# Patient Record
Sex: Male | Born: 1970 | Race: White | Hispanic: No | Marital: Married | Smoking: Former smoker
Health system: Southern US, Community
[De-identification: ages and names within clinical notes are randomized; demographics above are authoritative.]

## PROBLEM LIST (undated history)

## (undated) DIAGNOSIS — E0590 Thyrotoxicosis, unspecified without thyrotoxic crisis or storm: Secondary | ICD-10-CM

## (undated) DIAGNOSIS — J40 Bronchitis, not specified as acute or chronic: Secondary | ICD-10-CM

## (undated) DIAGNOSIS — E079 Disorder of thyroid, unspecified: Secondary | ICD-10-CM

## (undated) DIAGNOSIS — Z8719 Personal history of other diseases of the digestive system: Secondary | ICD-10-CM

## (undated) DIAGNOSIS — H0019 Chalazion unspecified eye, unspecified eyelid: Secondary | ICD-10-CM

## (undated) DIAGNOSIS — R0602 Shortness of breath: Secondary | ICD-10-CM

## (undated) DIAGNOSIS — K219 Gastro-esophageal reflux disease without esophagitis: Secondary | ICD-10-CM

## (undated) DIAGNOSIS — E039 Hypothyroidism, unspecified: Secondary | ICD-10-CM

## (undated) DIAGNOSIS — E7800 Pure hypercholesterolemia, unspecified: Secondary | ICD-10-CM

## (undated) HISTORY — PX: CHALAZION EXCISION: SHX213

---

## 1995-01-10 DIAGNOSIS — E0590 Thyrotoxicosis, unspecified without thyrotoxic crisis or storm: Secondary | ICD-10-CM

## 1995-01-10 DIAGNOSIS — E039 Hypothyroidism, unspecified: Secondary | ICD-10-CM

## 1995-01-10 DIAGNOSIS — K219 Gastro-esophageal reflux disease without esophagitis: Secondary | ICD-10-CM

## 1995-01-10 DIAGNOSIS — Z8719 Personal history of other diseases of the digestive system: Secondary | ICD-10-CM

## 1995-01-10 HISTORY — DX: Gastro-esophageal reflux disease without esophagitis: K21.9

## 1995-01-10 HISTORY — DX: Personal history of other diseases of the digestive system: Z87.19

## 1995-01-10 HISTORY — DX: Thyrotoxicosis, unspecified without thyrotoxic crisis or storm: E05.90

## 1995-01-10 HISTORY — DX: Hypothyroidism, unspecified: E03.9

## 2012-12-23 ENCOUNTER — Telehealth: Payer: Self-pay | Admitting: *Deleted

## 2012-12-27 ENCOUNTER — Ambulatory Visit: Payer: Self-pay | Admitting: Cardiovascular Disease

## 2012-12-30 ENCOUNTER — Encounter (HOSPITAL_COMMUNITY): Payer: Self-pay | Admitting: Emergency Medicine

## 2012-12-30 ENCOUNTER — Observation Stay (HOSPITAL_COMMUNITY)
Admission: EM | Admit: 2012-12-30 | Discharge: 2012-12-31 | Disposition: A | Discharge status: Home / Self Care | Payer: BC Managed Care – PPO | Attending: Cardiovascular Disease | Admitting: Cardiovascular Disease

## 2012-12-30 ENCOUNTER — Emergency Department (HOSPITAL_COMMUNITY): Payer: BC Managed Care – PPO

## 2012-12-30 DIAGNOSIS — J40 Bronchitis, not specified as acute or chronic: Secondary | ICD-10-CM | POA: Diagnosis present

## 2012-12-30 DIAGNOSIS — H0019 Chalazion unspecified eye, unspecified eyelid: Secondary | ICD-10-CM | POA: Insufficient documentation

## 2012-12-30 DIAGNOSIS — R0789 Other chest pain: Principal | ICD-10-CM

## 2012-12-30 DIAGNOSIS — R059 Cough, unspecified: Secondary | ICD-10-CM | POA: Insufficient documentation

## 2012-12-30 DIAGNOSIS — Z87891 Personal history of nicotine dependence: Secondary | ICD-10-CM | POA: Insufficient documentation

## 2012-12-30 DIAGNOSIS — E785 Hyperlipidemia, unspecified: Secondary | ICD-10-CM

## 2012-12-30 DIAGNOSIS — R072 Precordial pain: Secondary | ICD-10-CM

## 2012-12-30 DIAGNOSIS — R05 Cough: Secondary | ICD-10-CM | POA: Insufficient documentation

## 2012-12-30 DIAGNOSIS — K219 Gastro-esophageal reflux disease without esophagitis: Secondary | ICD-10-CM | POA: Insufficient documentation

## 2012-12-30 DIAGNOSIS — R079 Chest pain, unspecified: Secondary | ICD-10-CM

## 2012-12-30 DIAGNOSIS — E7800 Pure hypercholesterolemia, unspecified: Secondary | ICD-10-CM | POA: Insufficient documentation

## 2012-12-30 DIAGNOSIS — E039 Hypothyroidism, unspecified: Secondary | ICD-10-CM | POA: Insufficient documentation

## 2012-12-30 DIAGNOSIS — R0602 Shortness of breath: Secondary | ICD-10-CM | POA: Insufficient documentation

## 2012-12-30 DIAGNOSIS — Z8709 Personal history of other diseases of the respiratory system: Secondary | ICD-10-CM | POA: Insufficient documentation

## 2012-12-30 HISTORY — DX: Bronchitis, not specified as acute or chronic: J40

## 2012-12-30 HISTORY — DX: Shortness of breath: R06.02

## 2012-12-30 HISTORY — DX: Thyrotoxicosis, unspecified without thyrotoxic crisis or storm: E05.90

## 2012-12-30 HISTORY — DX: Gastro-esophageal reflux disease without esophagitis: K21.9

## 2012-12-30 HISTORY — DX: Pure hypercholesterolemia, unspecified: E78.00

## 2012-12-30 HISTORY — DX: Disorder of thyroid, unspecified: E07.9

## 2012-12-30 HISTORY — DX: Hypothyroidism, unspecified: E03.9

## 2012-12-30 HISTORY — DX: Personal history of other diseases of the digestive system: Z87.19

## 2012-12-30 HISTORY — DX: Chalazion unspecified eye, unspecified eyelid: H00.19

## 2012-12-30 LAB — D-DIMER, QUANTITATIVE: D-Dimer, Quant: <0.27 ug/mL-FEU (ref 0.00–0.48)

## 2012-12-30 LAB — TROPONIN I
Troponin I: <0.3 ng/mL (ref <0.30)
Troponin I: <0.3 ng/mL (ref <0.30)
Troponin I: <0.3 ng/mL (ref <0.30)

## 2012-12-30 LAB — BASIC METABOLIC PANEL
BUN: 10 mg/dL (ref 6–23)
CO2: 25 mEq/L (ref 19–32)
Calcium: 8.6 mg/dL (ref 8.4–10.5)
Creatinine, Ser: 0.86 mg/dL (ref 0.50–1.35)
GFR calc non Af Amer: >90 mL/min (ref >90)
Glucose, Bld: 99 mg/dL (ref 70–99)

## 2012-12-30 LAB — CBC
HCT: 40.3 % (ref 39.0–52.0)
Hemoglobin: 14.2 g/dL (ref 13.0–17.0)
MCH: 31.3 pg (ref 26.0–34.0)
MCHC: 35.2 g/dL (ref 30.0–36.0)
RBC: 4.54 MIL/uL (ref 4.22–5.81)

## 2012-12-30 LAB — TSH: TSH: 2.107 u[IU]/mL (ref 0.350–4.500)

## 2012-12-30 MED ORDER — LEVOTHYROXINE SODIUM 150 MCG PO TABS
150.0000 ug | ORAL_TABLET | Freq: Every day | ORAL | Status: DC
  Administered 2012-12-31: 150 ug via ORAL
  Filled 2012-12-30 (×2): qty 1

## 2012-12-30 MED ORDER — PANTOPRAZOLE SODIUM 40 MG PO TBEC
40.0000 mg | DELAYED_RELEASE_TABLET | Freq: Every day | ORAL | Status: DC
  Administered 2012-12-30 – 2012-12-31 (×2): 40 mg via ORAL
  Filled 2012-12-30 (×2): qty 1

## 2012-12-30 MED ORDER — ACETAMINOPHEN 325 MG PO TABS
650.0000 mg | ORAL_TABLET | ORAL | Status: DC | PRN
  Administered 2012-12-30 – 2012-12-31 (×4): 650 mg via ORAL
  Filled 2012-12-30 (×4): qty 2

## 2012-12-30 MED ORDER — ZOLPIDEM TARTRATE 5 MG PO TABS: 5.0000 mg | ORAL_TABLET | Freq: Every evening | ORAL | Status: DC | PRN

## 2012-12-30 MED ORDER — ASPIRIN 81 MG PO CHEW
324.0000 mg | CHEWABLE_TABLET | Freq: Once | ORAL | Status: AC
  Administered 2012-12-30: 162 mg via ORAL
  Filled 2012-12-30: qty 4

## 2012-12-30 MED ORDER — NITROGLYCERIN 0.4 MG SL SUBL
0.4000 mg | SUBLINGUAL_TABLET | SUBLINGUAL | Status: DC | PRN
  Filled 2012-12-30: qty 25

## 2012-12-30 MED ORDER — ALPRAZOLAM 0.25 MG PO TABS
0.2500 mg | ORAL_TABLET | Freq: Three times a day (TID) | ORAL | Status: DC | PRN
  Filled 2012-12-30: qty 1

## 2012-12-30 NOTE — H&P (Signed)
Patient ID: Steve Coleman MRN: [id number], DOB/AGE: [birth year]   Admit date: 12/30/2012   Primary Physician: No primary provider on file. Primary Cardiologist: Dr Allyson Sabal (new)  HPI: 42 y/o male with no prior cardiac issues except for tachycardia when he had hyperthyroidism > 10 yrs ago. He has noticed intermittent SSCP-"pressure" off and on since November. The first time he had it he was awakened from sleep at 3 am. He says it lasts one and a half to two hours. He denies any associated diaphoresis or nausea. He denies any radiation to his arms or jaw. He has a vague sense of SOB with this. He went to the ER in [facility] about a week ago for this and was told everything looked OK. He had an appointment with Dr Tresa Endo this past Friday but there was some confusion about his contact information and the appointment ended up getting rescheduled for Jan 13th.       He came to the ER today because he has been having this pain since 10pm last night. He says it's a 1-2/10. He is quit anxious about what this could be. He was told at [facility] that his EKG showed his "heart may be enlarged".    Problem List: Past Medical History  Diagnosis Date  . Hypercholesteremia   . Thyroid disease     History reviewed. No pertinent past surgical history.   Allergies:  Allergies  Allergen Reactions  . Statins     Causes chest pressure  . Epinephrine Palpitations    Heart racing       Home Medications Current Facility-Administered Medications  Medication Dose Route Frequency Provider Last Rate Last Dose  . nitroGLYCERIN (NITROSTAT) SL tablet 0.4 mg  0.4 mg Sublingual Q5 min PRN Audree Camel, MD       Current Outpatient Prescriptions  Medication Sig Dispense Refill  . levothyroxine (SYNTHROID, LEVOTHROID) 150 MCG tablet Take 150 mcg by mouth daily before breakfast.      . Multiple Vitamins-Minerals (CENTRUM SILVER ULTRA MENS PO) Take 1 tablet by mouth every morning.      . fenofibrate  micronized (LOFIBRA) 200 MG capsule Take 200 mg by mouth daily before breakfast.         FMHx: negative for CAD, DM   History   Social History  . Marital Status: Married    Spouse Name: N/A    Number of Children: N/A  . Years of Education: N/A   Occupational History  . Not on file.   Social History Main Topics  . Smoking status: Never Smoker   . Smokeless tobacco: Not on file  . Alcohol Use: No  . Drug Use: No  . Sexual Activity: Not on file   Other Topics Concern  . Not on file   Social History Narrative  . No narrative on file   He works at an [employer].   Review of Systems: General: negative for chills, fever, night sweats or weight changes.  Cardiovascular:negative for orthopnea, palpitations, paroxysmal nocturnal dyspnea or shortness of breath Dermatological: negative for rash Respiratory: negative for cough or wheezing Urologic: negative for hematuria Abdominal: negative for nausea, vomiting, diarrhea, bright red blood per rectum, melena, or hematemesis Neurologic: negative for visual changes, syncope, or dizziness All other systems reviewed and are otherwise negative except as noted above.  Physical Exam: Blood pressure 133/89, pulse 77, temperature 98.6 F (37 C), temperature source Oral, resp. rate 16, height 6' (1.829 m), weight 222 lb (100.699 kg), SpO2  99.00%.  General appearance: alert, cooperative and no distress Neck: no carotid bruit and no JVD Lungs: clear to auscultation bilaterally Heart: regular rate and rhythm, S1, S2 normal, no murmur, click, rub or gallop Abdomen: soft, non-tender; bowel sounds normal; no masses,  no organomegaly Extremities: extremities normal, atraumatic, no cyanosis or edema Pulses: 2+ and symmetric Skin: Skin color, texture, turgor normal. No rashes or lesions Neurologic: Grossly normal   Labs:   Results for orders placed during the hospital encounter of 12/30/12 (from the past 24 hour(s))  CBC     Status:  Abnormal   Collection Time    12/30/12  7:51 AM      Result Value Range   WBC 6.4  4.0 - 10.5 K/uL   RBC 4.54  4.22 - 5.81 MIL/uL   Hemoglobin 14.2  13.0 - 17.0 g/dL   HCT 16.1  09.6 - 04.5 %   MCV 88.8  78.0 - 100.0 fL   MCH 31.3  26.0 - 34.0 pg   MCHC 35.2  30.0 - 36.0 g/dL   RDW 40.9  81.1 - 91.4 %   Platelets 143 (*) 150 - 400 K/uL  BASIC METABOLIC PANEL     Status: None   Collection Time    12/30/12  7:51 AM      Result Value Range   Sodium 138  135 - 145 mEq/L   Potassium 3.9  3.5 - 5.1 mEq/L   Chloride 104  96 - 112 mEq/L   CO2 25  19 - 32 mEq/L   Glucose, Bld 99  70 - 99 mg/dL   BUN 10  6 - 23 mg/dL   Creatinine, Ser 7.82  0.50 - 1.35 mg/dL   Calcium 8.6  8.4 - 95.6 mg/dL   GFR calc non Af Amer >90  >90 mL/min   GFR calc Af Amer >90  >90 mL/min  POCT I-STAT TROPONIN I     Status: None   Collection Time    12/30/12  8:03 AM      Result Value Range   Troponin i, poc 0.00  0.00 - 0.08 ng/mL   Comment 3              Radiology/Studies: Dg Chest 2 View  12/30/2012   CLINICAL DATA:  Chest pain  EXAM: CHEST  2 VIEW  COMPARISON:  None.  FINDINGS: Lungs are clear. Heart size and pulmonary vascularity are normal. No adenopathy. No pneumothorax. No bone lesions.  IMPRESSION: No abnormality noted.   Electronically Signed   By: Bretta Bang M.D.   On: 12/30/2012 07:40    EKG:NSR without acute changes  ASSESSMENT AND PLAN:  Active Problems:   Chest tightness or pressure   Hypothyroid   Dyslipidemia   PLAN: Admit for obs, r/o MI. Check echo, schedule exercise Myoview for am.  Add d dimer and TSH to labs.    Deland Pretty, PA-C 12/30/2012, 9:57 AM  Agree with note written by Corine Shelter [facility]  Patient with cardiac risk factors notable for hyperlipidemia and to tolerate statin drugs. He's had 5-6 episodes of worrisome chest pain several which awakened him from sleep. He currently has mild low-grade chest pressure. The exam is benign, labs are normal an EKG  shows no acute changes. Plans Will be to heparinize him, put him in stepped-down and arranged to undergo Myoview stress testing tomorrow. If the stress test is normal he'll need a GI evaluation, otherwise he'll need cardiac catheterization.  Runell Gess  12/30/2012 2:29 PM

## 2012-12-30 NOTE — ED Notes (Signed)
Pt c/o mid sternal chest pressure intermittently x weeks; pt sts started last night; pt seen at [facility] for same with negative cardiac workup; pt sts feels like lump in throat and pressure to take deep breath

## 2012-12-30 NOTE — ED Provider Notes (Signed)
CSN: [id number]     Arrival date & time 12/30/12  4098 History   First MD Initiated Contact with Patient 12/30/12 0735     Chief Complaint  Patient presents with  . Chest Pain   (Consider location/radiation/quality/duration/timing/severity/associated sxs/prior Treatment) HPI Comments: 42 year old male presents with intermittent chest pain/pressure and dyspnea for the past 6-7 weeks. He states he gets it about once or twice a week her periods most recent time occurred about 10 hours ago and has been constant. He states feels like a 1/10 pressure with some trouble catching his breath. He does not seem to think it gets worse with deep breathing. Symptoms really hasn't a lump in his throat. There is no exertional symptoms, no fever, nausea, vomiting, or diaphoresis. Patient no abdominal symptoms. 10 days ago he went to [facility] ED because he had a prolonged episode similar to this and told he was not having a heart attack. That time they arrange for him to followup with Dr. Allyson Sabal, of [facility] heart and vascular. His appointment was 3 days ago when he arrived was a mixup in his address and they apparently had canceled his appointment. His next one is not for another 3 weeks. Due to this he presented to the ED because he wants to know what is going on. States he's had a dry cough but denies hemoptysis, denies recent travel or surgery, denies leg swelling. No history of blood clots per his knowledge.   Past Medical History  Diagnosis Date  . Hypercholesteremia   . Thyroid disease    History reviewed. No pertinent past surgical history. History reviewed. No pertinent family history. History  Substance Use Topics  . Smoking status: Never Smoker   . Smokeless tobacco: Not on file  . Alcohol Use: No    Review of Systems  Constitutional: Negative for fever, chills and diaphoresis.  Respiratory: Positive for cough and shortness of breath. Negative for wheezing.   Cardiovascular: Positive for chest  pain. Negative for leg swelling.  Gastrointestinal: Negative for nausea, vomiting and abdominal pain.  All other systems reviewed and are negative.    Allergies  Review of patient's allergies indicates no known allergies.  Home Medications  No current outpatient prescriptions on file. BP 121/76  Pulse 77  Temp(Src) 98.6 F (37 C) (Oral)  Resp 14  Ht 6' (1.829 m)  Wt 222 lb (100.699 kg)  BMI 30.10 kg/m2  SpO2 96% Physical Exam  Nursing note and vitals reviewed. Constitutional: He is oriented to person, place, and time. He appears well-developed and well-nourished. No distress.  HENT:  Head: Normocephalic and atraumatic.  Right Ear: External ear normal.  Left Ear: External ear normal.  Nose: Nose normal.  Eyes: Right eye exhibits no discharge. Left eye exhibits no discharge.  Neck: Neck supple.  Cardiovascular: Normal rate, regular rhythm, normal heart sounds and intact distal pulses.   Pulmonary/Chest: Effort normal and breath sounds normal. He has no wheezes. He exhibits no tenderness.  Abdominal: Soft. He exhibits no distension. There is no tenderness.  Musculoskeletal: He exhibits no edema.  Neurological: He is alert and oriented to person, place, and time.  Skin: Skin is warm and dry.    ED Course  Procedures (including critical care time) Labs Review Labs Reviewed  CBC - Abnormal; Notable for the following:    Platelets 143 (*)    All other components within normal limits  BASIC METABOLIC PANEL  D-DIMER, QUANTITATIVE  TSH  TROPONIN I  TROPONIN I  TROPONIN I  POCT I-STAT TROPONIN I   Imaging Review Dg Chest 2 View  12/30/2012   CLINICAL DATA:  Chest pain  EXAM: CHEST  2 VIEW  COMPARISON:  None.  FINDINGS: Lungs are clear. Heart size and pulmonary vascularity are normal. No adenopathy. No pneumothorax. No bone lesions.  IMPRESSION: No abnormality noted.   Electronically Signed   By: Bretta Bang M.D.   On: 12/30/2012 07:40    EKG Interpretation     Date/Time:  Monday December 30 2012 07:14:35 EST Ventricular Rate:  81 PR Interval:  140 QRS Duration: 86 QT Interval:  378 QTC Calculation: 439 R Axis:   91 Text Interpretation:  Normal sinus rhythm Rightward axis Borderline ECG no acute ischemia No old tracing to compare Confirmed by Caleen Taaffe  MD, Jannie Doyle ([id number]) on 12/30/2012 7:23:19 AM            MDM   1. Chest tightness or pressure    EKG and troponin benign. Patient is PERC negative and low risk, thus I feel further testing isn't warranted at this time. D/w Dr. Allyson Sabal, he will admit to obs for ACS r/o.     Audree Camel, MD 12/30/12 1022

## 2012-12-30 NOTE — ED Notes (Signed)
Pt currently refusing Nitro. EDP notified.

## 2012-12-30 NOTE — Progress Notes (Signed)
  Echocardiogram 2D Echocardiogram has been performed.  Cathie Beams 12/30/2012, 11:50 AM

## 2012-12-31 ENCOUNTER — Observation Stay (HOSPITAL_COMMUNITY): Payer: BC Managed Care – PPO

## 2012-12-31 ENCOUNTER — Encounter (HOSPITAL_COMMUNITY): Payer: Self-pay | Admitting: Cardiology

## 2012-12-31 ENCOUNTER — Encounter (HOSPITAL_COMMUNITY): Payer: BC Managed Care – PPO

## 2012-12-31 DIAGNOSIS — J40 Bronchitis, not specified as acute or chronic: Secondary | ICD-10-CM

## 2012-12-31 HISTORY — DX: Bronchitis, not specified as acute or chronic: J40

## 2012-12-31 LAB — INFLUENZA PANEL BY PCR (TYPE A & B)
H1N1 flu by pcr: NOT DETECTED
Influenza A By PCR: NEGATIVE
Influenza B By PCR: NEGATIVE

## 2012-12-31 MED ORDER — HYDROCOD POLST-CHLORPHEN POLST 10-8 MG/5ML PO LQCR: 5.0000 mL | Freq: Two times a day (BID) | ORAL | Status: DC | PRN

## 2012-12-31 MED ORDER — TECHNETIUM TC 99M SESTAMIBI GENERIC - CARDIOLITE
30.0000 | Freq: Once | INTRAVENOUS | Status: AC | PRN
  Administered 2012-12-31: 30 via INTRAVENOUS

## 2012-12-31 MED ORDER — GUAIFENESIN ER 600 MG PO TB12: 1200.0000 mg | ORAL_TABLET | Freq: Two times a day (BID) | ORAL | Status: AC

## 2012-12-31 MED ORDER — ACETAMINOPHEN 325 MG PO TABS: 650.0000 mg | ORAL_TABLET | ORAL | Status: DC | PRN

## 2012-12-31 MED ORDER — PANTOPRAZOLE SODIUM 40 MG PO TBEC: 40.0000 mg | DELAYED_RELEASE_TABLET | Freq: Every day | ORAL | Status: DC

## 2012-12-31 MED ORDER — LEVOFLOXACIN 500 MG PO TABS: 500.0000 mg | ORAL_TABLET | Freq: Every day | ORAL | Status: DC

## 2012-12-31 MED ORDER — TECHNETIUM TC 99M SESTAMIBI GENERIC - CARDIOLITE
10.0000 | Freq: Once | INTRAVENOUS | Status: AC | PRN
  Administered 2012-12-31: 10 via INTRAVENOUS

## 2012-12-31 MED ORDER — GUAIFENESIN ER 600 MG PO TB12
1200.0000 mg | ORAL_TABLET | Freq: Two times a day (BID) | ORAL | Status: DC
  Administered 2012-12-31: 1200 mg via ORAL
  Filled 2012-12-31 (×3): qty 2

## 2012-12-31 MED ORDER — LEVOFLOXACIN 500 MG PO TABS
500.0000 mg | ORAL_TABLET | Freq: Every day | ORAL | Status: DC
  Administered 2012-12-31: 500 mg via ORAL
  Filled 2012-12-31 (×2): qty 1

## 2012-12-31 NOTE — Discharge Summary (Signed)
Physician Discharge Summary       Patient ID: Steve Coleman MRN: [id number] DOB/AGE: 1970-04-01 42 y.o.  Admit date: 12/30/2012 Discharge date: 12/31/2012  Discharge Diagnoses:  Principal Problem:   Chest tightness or pressure, negative stress nuculear study, normal echo, possible GI Active Problems:   Hypothyroid   Dyslipidemia   Bronchitis   Discharged Condition: good  Procedures: none   Hospital Course: 42 y/o male with no prior cardiac issues except for tachycardia when he had hyperthyroidism > 10 yrs ago. He has noticed intermittent SSCP-"pressure" off and on since November. The first time he had it he was awakened from sleep at 3 am. He says it lasts one and a half to two hours. He denies any associated diaphoresis or nausea. He denies any radiation to his arms or jaw. He has a vague sense of SOB with this. He went to the ER in [facility] about a week ago for this and was told everything looked OK. He had an appointment with Dr Tresa Endo this past Friday but there was some confusion about his contact information and the appointment ended up getting rescheduled for Jan 13th. He came to the ER 12/30/12 because he has been having this pain since 10pm last night. He says it's a 1-2/10. He is quit anxious about what this could be. He was told at [facility] that his EKG showed his "heart may be enlarged".   CXR here without cardiomegaly.  Pt placed in OBS and troponins returned negative.  Echo was normal and exercise stress test was without ischemia.   Dr. Allyson Sabal saw and felt this may be GI and we will arrange GI consult.  Also he spiked a fever here and flu eval was negative.  He did receive flu vaccine in Nov.  I added ABX and musinex.  Pt was found stable for discharge home and will follow up as an outpt.     Consults: None  Significant Diagnostic Studies:  BMET    Component Value Date/Time   NA 138 12/30/2012 0751   K 3.9 12/30/2012 0751   CL 104 12/30/2012 0751   CO2  25 12/30/2012 0751   GLUCOSE 99 12/30/2012 0751   BUN 10 12/30/2012 0751   CREATININE 0.86 12/30/2012 0751   CALCIUM 8.6 12/30/2012 0751   GFRNONAA >90 12/30/2012 0751   GFRAA >90 12/30/2012 0751    CBC    Component Value Date/Time   WBC 6.4 12/30/2012 0751   RBC 4.54 12/30/2012 0751   HGB 14.2 12/30/2012 0751   HCT 40.3 12/30/2012 0751   PLT 143* 12/30/2012 0751   MCV 88.8 12/30/2012 0751   MCH 31.3 12/30/2012 0751   MCHC 35.2 12/30/2012 0751   RDW 12.5 12/30/2012 0751   ddimer <0.27, TSH 2.107,   Troponin I neg X 3.   CXR:   FINDINGS: Lungs are clear. Heart size and pulmonary vascularity are normal. No adenopathy. No pneumothorax. No bone lesions  EXERCISE MYOVIEW: FINDINGS: SPECT: No definite inducible or reversible ischemia with exercise stress. No fixed defects.  Wall motion: Grossly normal wall motion and thickening  Ejection fraction: Calculated ejection fraction is 63%.  IMPRESSION: Negative for inducible ischemia with exercise stress.  2D Echo: Left ventricle: The cavity size was normal. Systolic function was normal. The estimated ejection fraction was in the range of 55% to 60%. Wall motion was normal; there were no regional wall motion abnormalities. Left ventricular diastolic function parameters were normal  Discharge Exam: Blood pressure 133/73, pulse  108, temperature 99.9 F (37.7 C), temperature source Oral, resp. rate 18, height 6' (1.829 m), weight 222 lb (100.699 kg), SpO2 95.00%.   AM exam:PE: General:Pleasant affect, NAD  Skin:Warm and dry, brisk capillary refill  HEENT:normocephalic, sclera clear, mucus membranes moist, voice raspy  Heart:S1S2 RRR without murmur, gallup, rub or click  Lungs:clear without rales, rhonchi, or wheezes  ZOX:WRUE, non tender, + BS, do not palpate liver spleen or masses  Ext:no lower ext edema, 2+ pedal pulses, 2+ radial pulses  Neuro:alert and oriented, MAE, follows commands, + facial symmetry   Disposition:  01-Home or Self Care       Future Appointments Provider Department Dept Phone   01/21/2013 8:45 AM Runell Gess, MD [facility] [phone]       Medication List         acetaminophen 325 MG tablet  Commonly known as:  TYLENOL  Take 2 tablets (650 mg total) by mouth every 4 (four) hours as needed for fever, headache or mild pain.     CENTRUM SILVER ULTRA MENS PO  Take 1 tablet by mouth every morning.     chlorpheniramine-HYDROcodone 10-8 MG/5ML Lqcr  Commonly known as:  TUSSIONEX  Take 5 mLs by mouth every 12 (twelve) hours as needed for cough.     fenofibrate micronized 200 MG capsule  Commonly known as:  LOFIBRA  Take 200 mg by mouth daily before breakfast.     guaiFENesin 600 MG 12 hr tablet  Commonly known as:  MUCINEX  Take 2 tablets (1,200 mg total) by mouth 2 (two) times daily.     levofloxacin 500 MG tablet  Commonly known as:  LEVAQUIN  Take 1 tablet (500 mg total) by mouth daily.     levothyroxine 150 MCG tablet  Commonly known as:  SYNTHROID, LEVOTHROID  Take 150 mcg by mouth daily before breakfast.     pantoprazole 40 MG tablet  Commonly known as:  PROTONIX  Take 1 tablet (40 mg total) by mouth daily at 6 (six) AM.       Follow-up Information   Follow up with Runell Gess, MD. (our office will call with date and time)    Specialty:  Cardiology   Contact information:   [address] [place] [address] [phone]        Discharge Instructions:Take protonix daily  Added Musinex over the counter  I gave a cough syrup and an antibiotic.  Flu screen was negative.  We will schedule you to see Dr. Elnoria Howard with GI for eval.  Signed: Leone Brand Nurse Practitioner-Certified [facility]: [facility] 12/31/2012, 6:19 PM  Time spent on discharge : 40 minutes.

## 2012-12-31 NOTE — Progress Notes (Signed)
Discharge instructions and prescriptions given.  No questions asked.  Patient left via walking to family member waiting to take him home.    Rubye Oaks

## 2012-12-31 NOTE — Progress Notes (Signed)
UR completed 

## 2012-12-31 NOTE — Progress Notes (Signed)
         Subjective: Ready for nuc study  Objective: Vital signs in last 24 hours: Temp:  [98.2 F (36.8 C)-101.2 F (38.4 C)] 99.9 F (37.7 C) (12/23 0609) Pulse Rate:  [87-108] 108 (12/23 0609) Resp:  [15-18] 18 (12/23 0609) BP: (101-128)/(63-79) 101/63 mmHg (12/23 0609) SpO2:  [95 %-98 %] 95 % (12/23 0609) Weight change:    Intake/Output from previous day:  +240 12/22 0701 - 12/23 0700 In: 240 [P.O.:240] Out: -  Intake/Output this shift:    PE: General:Pleasant affect, NAD Skin:Warm and dry, brisk capillary refill HEENT:normocephalic, sclera clear, mucus membranes moist, voice raspy Heart:S1S2 RRR without murmur, gallup, rub or click Lungs:clear without rales, rhonchi, or wheezes ZOX:WRUE, non tender, + BS, do not palpate liver spleen or masses Ext:no lower ext edema, 2+ pedal pulses, 2+ radial pulses Neuro:alert and oriented, MAE, follows commands, + facial symmetry   Lab Results:  Recent Labs  12/30/12 0751  WBC 6.4  HGB 14.2  HCT 40.3  PLT 143*   BMET  Recent Labs  12/30/12 0751  NA 138  K 3.9  CL 104  CO2 25  GLUCOSE 99  BUN 10  CREATININE 0.86  CALCIUM 8.6    Recent Labs  12/30/12 1535 12/30/12 2217  TROPONINI <0.30 <0.30    No results found for this basename: CHOL,  HDL,  LDLCALC,  LDLDIRECT,  TRIG,  CHOLHDL   No results found for this basename: HGBA1C     Lab Results  Component Value Date   TSH 2.107 12/30/2012    2 D echo: Left ventricle: The cavity size was normal. Systolic function was normal. The estimated ejection fraction was in the range of 55% to 60%. Wall motion was normal; there were no regional wall motion abnormalities. Left ventricular diastolic function parameters were normal   Studies/Results: Dg Chest 2 View  12/30/2012   CLINICAL DATA:  Chest pain  EXAM: CHEST  2 VIEW  COMPARISON:  None.  FINDINGS: Lungs are clear. Heart size and pulmonary vascularity are normal. No adenopathy. No pneumothorax. No bone  lesions.  IMPRESSION: No abnormality noted.   Electronically Signed   By: Bretta Bang M.D.   On: 12/30/2012 07:40    Medications: I have reviewed the patient's current medications. Scheduled Meds: . levothyroxine  150 mcg Oral QAC breakfast  . pantoprazole  40 mg Oral Q0600   Continuous Infusions:  PRN Meds:.acetaminophen, ALPRAZolam, nitroGLYCERIN, zolpidem  Assessment/Plan: Principal Problem:   Chest tightness or pressure Active Problems:   Hypothyroid   Dyslipidemia  PLAN: negative MI, normal echo.  For nuc study. Fever during the night, pt did receive flu vaccine in November.  Influenza panel has been sent.  Will add musinex and Levaquin with brown productive mucus.  Pt intolerant to PCN and has had problems with erythromycin in the past.  Flu study negative, treating URI. No pna on CXR.   Stress myoview completed without complications.   LOS: 1 day   Time spent with pt. :15 minutes. [facility] R  Nurse Practitioner Certified Pager [phone] or after 5pm and on weekends call [phone] 12/31/2012, 9:56 AM   Agree with note written by Nada Boozer RNP  Myoview neg. On ATBX for ? Bronchitis. OK for D/C home . Needs appt with Dr Elnoria Howard for GI eval then ROV with me 3 months.  Runell Gess 12/31/2012 2:36 PM

## 2013-01-01 ENCOUNTER — Telehealth: Payer: Self-pay | Admitting: Cardiovascular Disease

## 2013-01-01 NOTE — Telephone Encounter (Signed)
Per Nada Boozer, NP----patient needs to see Dr. Jeani Hawking for non cardiac chest pain.  I scheduled appointment for Monday 01/06/13 @ 2:30 pm.  I spoke with Mr. Knerr @ 8:42 am and he is aware of the appontment--I also gave him Dr. Haywood Pao address and phone number.

## 2013-01-01 NOTE — Progress Notes (Signed)
01/01/2013 1210 No NCM needs identified. Isidoro Donning RN CCM Case Mgmt phone [phone]

## 2013-01-21 ENCOUNTER — Ambulatory Visit: Payer: Self-pay | Admitting: Cardiovascular Disease

## 2013-05-01 NOTE — Telephone Encounter (Signed)
Encounter Closed on 05/01/13 by TP 

## 2013-10-24 ENCOUNTER — Other Ambulatory Visit: Payer: Self-pay

## 2017-02-13 ENCOUNTER — Ambulatory Visit
Admission: EM | Admit: 2017-02-13 | Discharge: 2017-02-13 | Disposition: A | Discharge status: Home / Self Care | Payer: BLUE CROSS/BLUE SHIELD | Attending: Family Medicine | Admitting: Family Medicine

## 2017-02-13 ENCOUNTER — Encounter: Payer: Self-pay | Admitting: *Deleted

## 2017-02-13 ENCOUNTER — Other Ambulatory Visit: Payer: Self-pay

## 2017-02-13 DIAGNOSIS — J069 Acute upper respiratory infection, unspecified: Secondary | ICD-10-CM | POA: Diagnosis not present

## 2017-02-13 MED ORDER — FLUTICASONE PROPIONATE 50 MCG/ACT NA SUSP: 2.0000 | Freq: Every day | NASAL | 0 refills | Status: AC

## 2017-02-13 MED ORDER — BENZONATATE 200 MG PO CAPS: ORAL_CAPSULE | ORAL | 0 refills | Status: AC

## 2017-02-13 NOTE — ED Provider Notes (Signed)
[facility]    CSN: [id number] Arrival date & time: 02/13/17  1250     History   Chief Complaint Chief Complaint  Patient presents with  . Cough  . Headache    HPI Steve Coleman is a 47 y.o. male.   HPI  47 year old male who started having symptoms of nasal Desgen cough and headache along with fever 3 days ago.  Seem to worsen.  Had some chills fever today of 99.4.  O2 sats are 97% on room air.       Past Medical History:  Diagnosis Date  . Bronchitis 12/31/2012  . Chalazion   . Exertional shortness of breath    "walking up flights of stairs; last 6=-8 weeks' (12/30/2012)  . GERD (gastroesophageal reflux disease) 1997   hx  . H/O hiatal hernia 1997   hx  . High cholesterol   . Hypercholesteremia   . Hyperthyroidism 1997   hx  . Hypothyroidism 1997   "had thyroid radiated" (12/30/2012)  . Thyroid disease     Patient Active Problem List   Diagnosis Date Noted  . Bronchitis 12/31/2012  . Chest tightness or pressure, negative stress nuculear study, normal echo, possible GI 12/30/2012  . Hypothyroid 12/30/2012  . Dyslipidemia 12/30/2012    Past Surgical History:  Procedure Laterality Date  . CHALAZION EXCISION Bilateral [id number]; 2010-2013   "involving all 4 eyelids; I've had 8-12; last one was < 1 yr ago" (12/30/2012)       Home Medications    Prior to Admission medications   Medication Sig Start Date End Date Taking? Authorizing Provider  fenofibrate micronized (LOFIBRA) 200 MG capsule Take 200 mg by mouth daily before breakfast.   Yes [provider]  guaiFENesin (MUCINEX) 600 MG 12 hr tablet Take 2 tablets (1,200 mg total) by mouth 2 (two) times daily. 12/31/12  Yes Leone BrandIngold, Laura R, NP  levothyroxine (SYNTHROID, LEVOTHROID) 150 MCG tablet Take 150 mcg by mouth daily before breakfast.   Yes [provider]  benzonatate (TESSALON) 200 MG capsule Take one cap TID PRN cough 02/13/17   Lutricia Feiloemer, Kaili Castille P, PA-C    fluticasone (FLONASE) 50 MCG/ACT nasal spray Place 2 sprays into both nostrils daily. 02/13/17   Lutricia Feiloemer, Kalifa Cadden P, PA-C    Family History Family History  Problem Relation Age of Onset  . Hyperlipidemia Mother   . Heart failure Father   . Hyperlipidemia Father   . Cancer Father     Social History Social History   Tobacco Use  . Smoking status: Former Smoker    Packs/day: 0.12    Years: 4.00    Pack years: 0.48    Types: Cigarettes    Last attempt to quit: 06/10/1991    Years since quitting: 25.6  . Smokeless tobacco: Former User    Types: Snuff, Dorna BloomChew    Quit date: 06/10/1986  Substance Use Topics  . Alcohol use: Yes    Alcohol/week: 4.2 oz    Types: 1 Glasses of wine, 4 Cans of beer, 2 Shots of liquor per week  . Drug use: No     Allergies   Augmentin [amoxicillin-pot clavulanate]; Erythromycin; Statins; and Epinephrine   Review of Systems Review of Systems  Constitutional: Positive for activity change, chills, fatigue and fever.  HENT: Positive for congestion, postnasal drip, sinus pressure and sinus pain.   Respiratory: Positive for cough.   All other systems reviewed and are negative.    Physical Exam Triage Vital Signs ED Triage  Vitals  Enc Vitals Group     BP 02/13/17 1331 126/75     Pulse Rate 02/13/17 1331 90     Resp 02/13/17 1331 16     Temp 02/13/17 1331 99.4 F (37.4 C)     Temp Source 02/13/17 1331 Oral     SpO2 02/13/17 1331 97 %     Weight 02/13/17 1333 210 lb (95.3 kg)     Height 02/13/17 1333 6' (1.829 m)     Head Circumference --      Peak Flow --      Pain Score 02/13/17 1333 0     Pain Loc --      Pain Edu? --      Excl. in GC? --    No data found.  Updated Vital Signs BP 126/75 (BP Location: Left Arm)   Pulse 90   Temp 99.4 F (37.4 C) (Oral)   Resp 16   Ht 6' (1.829 m)   Wt 210 lb (95.3 kg)   SpO2 97%   BMI 28.48 kg/m   Visual Acuity Right Eye Distance:   Left Eye Distance:   Bilateral Distance:    Right Eye Near:    Left Eye Near:    Bilateral Near:     Physical Exam  Constitutional: He is oriented to person, place, and time. He appears well-developed and well-nourished.  Non-toxic appearance. He does not appear ill. No distress.  HENT:  Head: Normocephalic.  Eyes: Pupils are equal, round, and reactive to light.  Neck: Normal range of motion. Neck supple.  Pulmonary/Chest: Effort normal and breath sounds normal.  Musculoskeletal: Normal range of motion.  Neurological: He is alert and oriented to person, place, and time.  Skin: Skin is warm and dry.  Psychiatric: He has a normal mood and affect. His behavior is normal.  Nursing note and vitals reviewed.    UC Treatments / Results  Labs (all labs ordered are listed, but only abnormal results are displayed) Labs Reviewed - No data to display  EKG  EKG Interpretation None       Radiology No results found.  Procedures Procedures (including critical care time)  Medications Ordered in UC Medications - No data to display   Initial Impression / Assessment and Plan / UC Course  I have reviewed the triage vital signs and the nursing notes.  Pertinent labs & imaging results that were available during my care of the patient were reviewed by me and considered in my medical decision making (see chart for details).     Plan: 1. Test/x-ray results and diagnosis reviewed with patient 2. rx as per orders; risks, benefits, potential side effects reviewed with patient 3. Recommend supportive treatment with stent fluids as necessary.  The patient with Tessalon Perles nighttime cough control and also recommend highly that he use Flonase nasal spray on a daily basis for about a month.  If he is not improving he should follow-up with his primary care physician or his home. 4. F/u prn if symptoms worsen or don't improve   Final Clinical Impressions(s) / UC Diagnoses   Final diagnoses:  Upper respiratory tract infection, unspecified type    ED  Discharge Orders        Ordered    benzonatate (TESSALON) 200 MG capsule     02/13/17 1518    fluticasone (FLONASE) 50 MCG/ACT nasal spray  Daily     02/13/17 1518       Controlled Substance Prescriptions [place] Controlled  Substance Registry consulted? Not Applicable   Lutricia Feil, PA-C 02/13/17 2126

## 2017-02-13 NOTE — ED Triage Notes (Signed)
Patient started having symptoms nasal congestion, cough, headache and fever 3 days ago.
